# Patient Record
Sex: Male | Born: 1975 | Race: White | Hispanic: No | Marital: Single | Smoking: Never smoker
Health system: Southern US, Community
[De-identification: ages and names within clinical notes are randomized; demographics above are authoritative.]

---

## 2009-05-21 ENCOUNTER — Emergency Department (HOSPITAL_COMMUNITY): Admission: EM | Admit: 2009-05-21 | Discharge: 2009-05-22 | Payer: Self-pay | Admitting: Emergency Medicine

## 2010-01-08 ENCOUNTER — Encounter: Admission: RE | Admit: 2010-01-08 | Discharge: 2010-01-08 | Payer: Self-pay | Admitting: Neurology

## 2010-08-30 LAB — URINALYSIS, ROUTINE W REFLEX MICROSCOPIC
Bilirubin Urine: NEGATIVE
Glucose, UA: NEGATIVE mg/dL
Leukocytes, UA: NEGATIVE
Nitrite: NEGATIVE
Specific Gravity, Urine: 1.018 (ref 1.005–1.030)

## 2010-08-30 LAB — URINE MICROSCOPIC-ADD ON
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
## 2014-12-12 ENCOUNTER — Encounter (HOSPITAL_COMMUNITY): Payer: Self-pay

## 2014-12-12 ENCOUNTER — Emergency Department (HOSPITAL_COMMUNITY)
Admission: EM | Admit: 2014-12-12 | Discharge: 2014-12-12 | Disposition: A | Discharge status: Home / Self Care | Payer: BC Managed Care – PPO | Source: Home / Self Care | Attending: Family Medicine | Admitting: Family Medicine

## 2014-12-12 DIAGNOSIS — Z23 Encounter for immunization: Secondary | ICD-10-CM | POA: Diagnosis not present

## 2014-12-12 DIAGNOSIS — S0191XA Laceration without foreign body of unspecified part of head, initial encounter: Secondary | ICD-10-CM | POA: Diagnosis not present

## 2014-12-12 MED ORDER — TETANUS-DIPHTH-ACELL PERTUSSIS 5-2.5-18.5 LF-MCG/0.5 IM SUSP
0.5000 mL | Freq: Once | INTRAMUSCULAR | Status: AC
  Administered 2014-12-12: 0.5 mL via INTRAMUSCULAR

## 2014-12-12 NOTE — ED Provider Notes (Addendum)
CSN: [id number]     Arrival date & time 12/12/14  1841 History   First MD Initiated Contact with Patient 12/12/14 1932     Chief Complaint  Patient presents with  . Laceration   (Consider location/radiation/quality/duration/timing/severity/associated sxs/prior Treatment) HPI Laceration. Sustained one hour ago. Patient ran into the doorway. Unsure of last tetanus. Area started to bleed and immediately swell. This was improved with pressure. Denies any confusion, headache, loss of consciousness. Patient is not on any blood thinners.   History reviewed. No pertinent past medical history. History reviewed. No pertinent past surgical history. History reviewed. No pertinent family history. History  Substance Use Topics  . Smoking status: Never Smoker   . Smokeless tobacco: Not on file  . Alcohol Use: Yes    Review of Systems Per HPI with all other pertinent systems negative.   Allergies  Review of patient's allergies indicates no known allergies.  Home Medications   Prior to Admission medications   Not on File   BP 129/73 mmHg  Pulse 77  Temp(Src) 97.5 F (36.4 C) (Oral)  Resp 16  SpO2 99% Physical Exam Physical Exam  Constitutional: oriented to person, place, and time. appears well-developed and well-nourished. No distress.  HENT:  Head: Normocephalic and atraumatic.  Eyes: EOMI. PERRL.  Neck: Normal range of motion.  Cardiovascular: RRR, no m/r/g, 2+ distal pulses,  Pulmonary/Chest: Effort normal and breath sounds normal. No respiratory distress.  Abdominal: Soft. Bowel sounds are normal. NonTTP, no distension.  Musculoskeletal: Normal range of motion. Non ttp, no effusion.  Neurological: alert and oriented to person, place, and time.  Skin: 2.5 vertical linear laceration above the right eyebrow. This extends through the epidermis but not through the dermis.  Psychiatric: normal mood and affect. behavior is normal. Judgment and thought content normal.   ED Course   Procedures (including critical care time) Labs Review Labs Reviewed - No data to display  Imaging Review No results found.   MDM   1. Laceration of head, initial encounter    Simple laceration repaired with Steri-Strips. Patient given very detailed wound care instructions. Antibiotic ointment applied.  Tdap given  Ozella Rocksavid J Merrell, MD 12/12/14 [id number]  Ozella Rocksavid J Merrell, MD 12/17/14 [id number]

## 2014-12-12 NOTE — Discharge Instructions (Signed)
Laceration you sustained to your head fortunately only went through the epidermis and on the dermis. This should allow your head to heal very quickly. 2 Steri-Strips were applied to help improve wound healing and to prevent further damage to your skin. Please trim off the edges of the Steri-Strips over the next several days as they start to peel. Please remember to protect the area from sun damage and to apply vitamin E ointment to help soften the scar over time.
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
## 2022-06-02 ENCOUNTER — Encounter: Payer: Self-pay | Admitting: Neurology

## 2022-06-27 ENCOUNTER — Ambulatory Visit: Payer: BC Managed Care – PPO | Admitting: Neurology

## 2022-06-27 ENCOUNTER — Encounter: Payer: Self-pay | Admitting: Neurology

## 2022-06-27 VITALS — BP 128/87 | HR 75 | Ht 70.0 in | Wt 234.0 lb

## 2022-06-27 DIAGNOSIS — G44221 Chronic tension-type headache, intractable: Secondary | ICD-10-CM
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
## 2022-09-26 ENCOUNTER — Ambulatory Visit: Payer: BC Managed Care – PPO | Admitting: Neurology
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
## 2022-11-22 ENCOUNTER — Encounter: Payer: Self-pay | Admitting: Neurology

## 2022-11-22 ENCOUNTER — Ambulatory Visit: Payer: BC Managed Care – PPO | Admitting: Neurology

## 2022-11-22 VITALS — BP 127/82 | HR 85 | Ht 70.0 in | Wt 223.0 lb

## 2022-11-22 DIAGNOSIS — M7918 Myalgia, other site: Secondary | ICD-10-CM

## 2022-11-22 DIAGNOSIS — G44221 Chronic tension-type headache, intractable: Secondary | ICD-10-CM

## 2022-11-22 MED ORDER — NORTRIPTYLINE HCL 10 MG PO CAPS: 10.0000 mg | ORAL_CAPSULE | Freq: Every day | ORAL | 5 refills | Status: DC

## 2022-11-22 NOTE — Patient Instructions (Signed)
Stop topiramate.  Start nortriptyline 10mg  at bedtime.  If no improvement in 4 weeks, contact me Cyclobenzaprine 10mg  twice daily Refer to [facility] Limit use of pain relievers to no more than 2 days out of week to prevent risk of rebound or medication-overuse headache. Keep headache diary Follow up 6 months.

## 2022-12-05 ENCOUNTER — Ambulatory Visit (INDEPENDENT_AMBULATORY_CARE_PROVIDER_SITE_OTHER): Payer: BC Managed Care – PPO

## 2022-12-05 ENCOUNTER — Ambulatory Visit: Payer: BC Managed Care – PPO | Admitting: Sports Medicine

## 2022-12-05 VITALS — BP 120/80 | HR 83 | Ht 70.0 in | Wt 223.0 lb

## 2022-12-05 DIAGNOSIS — M542 Cervicalgia: Secondary | ICD-10-CM | POA: Diagnosis not present

## 2022-12-05 DIAGNOSIS — M9908 Segmental and somatic dysfunction of rib cage: Secondary | ICD-10-CM

## 2022-12-05 DIAGNOSIS — R519 Headache, unspecified: Secondary | ICD-10-CM | POA: Diagnosis not present

## 2022-12-05 DIAGNOSIS — M9902 Segmental and somatic dysfunction of thoracic region: Secondary | ICD-10-CM | POA: Diagnosis not present

## 2022-12-05 DIAGNOSIS — G8929 Other chronic pain: Secondary | ICD-10-CM | POA: Diagnosis not present

## 2022-12-05 DIAGNOSIS — M9901 Segmental and somatic dysfunction of cervical region: Secondary | ICD-10-CM | POA: Diagnosis not present

## 2022-12-05 MED ORDER — MELOXICAM 15 MG PO TABS: 15.0000 mg | ORAL_TABLET | Freq: Every day | ORAL | 0 refills | Status: DC

## 2022-12-05 NOTE — Progress Notes (Signed)
Dg                [name] D.[facility] [address] [place] [address] Phone: [phone]   Assessment and Plan:     1. Neck pain 2. Chronic intractable headache, unspecified headache type 3. Somatic dysfunction of cervical region 4. Somatic dysfunction of thoracic region 5. Somatic dysfunction of rib region -Chronic with exacerbation, initial sports medicine visit - Patient presenting with ongoing bilateral headaches that are constant over the past 1 to 2 years.  Patient additionally has muscular tightness through cervical paraspinal, trapezius bilaterally that could contribute to tension headaches.  Patient has had thorough workup for headaches thus far with relatively unremarkable brain MRI only showing sinus inflammation.  Patient has seen Dr. Everlena Cooper, neurology, who has started patient on Flexeril and amitriptyline.  Patient has noticed mild to moderate improvement since starting these medications, though continues to have constant daily headaches - I do believe there is at least a portion of patient's pain that is musculoskeletal based on HPI, physical exam, loss of general cervical lordosis, improvements with "neck cracking" -Recommend using a short 2 to 3-week course of prescription NSAIDs to see if there is an inflammatory component to patient's pain.  Risk of short-term rebound headache after completion of NSAID course.  Start meloxicam 15 mg daily x2 weeks.  If still having pain after 2 weeks, complete 3rd-week of meloxicam. May use remaining meloxicam as needed once daily for pain control.  Do not to use additional NSAIDs while taking meloxicam.  May use Tylenol [phone] mg 2 to 3 times a day for breakthrough pain.  -Start HEP for neck and trapezius - Patient elected for initial OMT today.  Tolerated well per note below. - Decision today to treat with OMT was based on Physical Exam -X-ray obtained in clinic.  My interpretation: No acute fracture or  vertebral collapse.  Anterior vertebral spurring at C4.  Mild loss of typical cervical lordosis  After verbal consent patient was treated with HVLA (high velocity low amplitude), ME (muscle energy), FPR (flex positional release), ST (soft tissue),  techniques in cervical, rib, thoracic,  areas. Patient tolerated the procedure well with improvement in symptoms.  Patient educated on potential side effects of soreness and recommended to rest, hydrate, and use Tylenol as needed for pain control.     Pertinent previous records reviewed include brain MRI in 06/20/2010, neurology note 11/22/2022, ENT note 08/09/2022   Follow Up: 4 weeks for reevaluation.  Could consider repeat OMT if patient tolerated today's procedure well.   Subjective:   I, [name], am serving as a Neurosurgeon for Doctor Richardean Sale  Chief Complaint: neck pain   HPI:   12/05/22 Patient is a 47 year old male complaining of neck pain. Patient states that he has headaches and he cracks his neck and that gives him some relief. Flexeril and nortriptyline help with headaches. Pain for a year to two years. No radiating pain. Just headache, neck, and upper trap pain and knots. Has tried chiro, massage, heat and ice. No numbness and tingling. Chirps seem to help    Relevant Historical Information: None pertinent  Additional pertinent review of systems negative.   Current Outpatient Medications:    cyclobenzaprine (FLEXERIL) 10 MG tablet, Take 10 mg by mouth 3 (three) times daily., Disp: , Rfl:    meloxicam (MOBIC) 15 MG tablet, Take 1 tablet (15 mg total) by mouth daily., Disp: 30 tablet, Rfl: 0  nortriptyline (PAMELOR) 10 MG capsule, Take 1 capsule (10 mg total) by mouth at bedtime., Disp: 30 capsule, Rfl: 5   Objective:     Vitals:   12/05/22 1537  BP: 120/80  Pulse: 83  SpO2: 96%  Weight: 223 lb (101.2 kg)  Height: 5\' 10"  (1.778 m)      Body mass index is 32 kg/m.    Physical Exam:    Neck Exam: Cervical  Spine- Posture normal Skin- normal, intact  Neuro:  Strength-  Right Left   Deltoid (C5) 5/5 5/5  Bicep/Brachioradialis (C5/6) 5/5  5/5  Wrist Extension (C6) 5/5 5/5  Tricep (C7) 5/5 5/5  Wrist Flexion (C7) 5/5 5/5  Grip (C8) 5/5 5/5  Finger Abduction (T1) 5/5 5/5   Sensation: intact to light touch in upper extremities bilaterally  Spurling's:  negative bilaterally Neck ROM: Full active ROM   NTTP: cervical spinous processes, cervical paraspinal, thoracic paraspinal, trapezius     OMT Physical Exam:  ASIS Compression Test: Positive Right Cervical: NTTP paraspinal, C3 RR SL, C6 RLSR Rib: Bilateral elevated first rib with TTP Thoracic: NTTP paraspinal, T4-6 RRSL, T7-9 RLSR    Electronically signed by:  [name] D.[facility] 4:46 PM 12/05/22

## 2022-12-05 NOTE — Patient Instructions (Signed)
-   Start meloxicam 15 mg daily x2 weeks.  If still having pain after 2 weeks, complete 3rd-week of meloxicam. May use remaining meloxicam as needed once daily for pain control.  Do not to use additional NSAIDs while taking meloxicam.  May use Tylenol [phone] mg 2 to 3 times a day for breakthrough pain. Neck and trap HEP  4 week follow up [facility]

## 2022-12-15 ENCOUNTER — Other Ambulatory Visit: Payer: Self-pay | Admitting: Neurology

## 2022-12-15 ENCOUNTER — Encounter: Payer: Self-pay | Admitting: Neurology

## 2022-12-15 NOTE — Telephone Encounter (Signed)
New start 3 weeks ago, Has been taken it if not please take the one month for now. If he hast taken for the last three weeks we will send in a 90 day supply for reaming refills.

## 2022-12-22 ENCOUNTER — Other Ambulatory Visit: Payer: Self-pay

## 2022-12-22 MED ORDER — CYCLOBENZAPRINE HCL 10 MG PO TABS: 10.0000 mg | ORAL_TABLET | Freq: Three times a day (TID) | ORAL | 5 refills | Status: DC

## 2022-12-22 MED ORDER — NORTRIPTYLINE HCL 25 MG PO CAPS: 25.0000 mg | ORAL_CAPSULE | Freq: Every day | ORAL | 0 refills | Status: DC

## 2022-12-22 MED ORDER — PREDNISONE 10 MG (21) PO TBPK: ORAL_TABLET | ORAL | 0 refills | Status: DC

## 2022-12-22 NOTE — Progress Notes (Signed)
Per Dr.Jaffe, 1. Since this headache has been persistent for a week, I would like to prescribe a prednisone taper (if he is agreeable, please send in prescription).   2.  Going forward, when he gets a headache attack, he should take the cyclobenzaprine.  May take with ibuprofen, Aleve/naproxen or Excedrin but must limit use to no more than 2 days out of week to prevent rebound headache.  Do not take an NSAID (such as ibuprofen or Aleve) while on the prednisone taper. 3.  Increase nortriptyline to 25mg  at bedtime.  We can increase dose in 4 weeks if needed.

## 2023-01-03 NOTE — Progress Notes (Unsigned)
    [name] [address] [place] [address] Phone: [phone]   Assessment and Plan:     There are no diagnoses linked to this encounter.  ***   Pertinent previous records reviewed include ***   Follow Up: ***     Subjective:   I, Madisun Hargrove, am serving as a Neurosurgeon for Doctor Richardean Sale   Chief Complaint: neck pain    HPI:    12/05/22 Patient is a 47 year old male complaining of neck pain. Patient states that he has headaches and he cracks his neck and that gives him some relief. Flexeril and nortriptyline help with headaches. Pain for a year to two years. No radiating pain. Just headache, neck, and upper trap pain and knots. Has tried chiro, massage, heat and ice. No numbness and tingling. Chirps seem to help    01/04/2023 Patient states    Relevant Historical Information: None pertinent  Additional pertinent review of systems negative.   Current Outpatient Medications:    cyclobenzaprine (FLEXERIL) 10 MG tablet, Take 1 tablet (10 mg total) by mouth 3 (three) times daily., Disp: 90 tablet, Rfl: 5   meloxicam (MOBIC) 15 MG tablet, Take 1 tablet (15 mg total) by mouth daily., Disp: 30 tablet, Rfl: 0   nortriptyline (PAMELOR) 25 MG capsule, Take 1 capsule (25 mg total) by mouth at bedtime., Disp: 30 capsule, Rfl: 0   predniSONE (STERAPRED UNI-PAK 21 TAB) 10 MG (21) TBPK tablet, take 60mg  day 1, then 50mg  day 2, then 40mg  day 3, then 30mg  day 4, then 20mg  day 5, then 10mg  day 6, then STOP, Disp: 21 tablet, Rfl: 0   Objective:     There were no vitals filed for this visit.    There is no height or weight on file to calculate BMI.    Physical Exam:    ***   Electronically signed by:  [name] 12:31 PM 01/03/23

## 2023-01-04 ENCOUNTER — Ambulatory Visit: Payer: BC Managed Care – PPO | Admitting: Sports Medicine

## 2023-01-04 VITALS — BP 132/82 | HR 87 | Ht 70.0 in | Wt 222.0 lb

## 2023-01-04 DIAGNOSIS — M9908 Segmental and somatic dysfunction of rib cage: Secondary | ICD-10-CM | POA: Diagnosis not present

## 2023-01-04 DIAGNOSIS — M542 Cervicalgia: Secondary | ICD-10-CM

## 2023-01-04 DIAGNOSIS — M9902 Segmental and somatic dysfunction of thoracic region: Secondary | ICD-10-CM | POA: Diagnosis not present

## 2023-01-04 DIAGNOSIS — M9901 Segmental and somatic dysfunction of cervical region: Secondary | ICD-10-CM

## 2023-01-04 DIAGNOSIS — G8929 Other chronic pain: Secondary | ICD-10-CM

## 2023-01-04 DIAGNOSIS — R519 Headache, unspecified: Secondary | ICD-10-CM

## 2023-01-04 NOTE — Patient Instructions (Signed)
Pt referral  Discontinue meloxicam and use remainder as needed 4 week follow up

## 2023-01-13 ENCOUNTER — Other Ambulatory Visit: Payer: Self-pay | Admitting: Neurology

## 2023-01-31 NOTE — Progress Notes (Deleted)
   [name] [facility] [address] [place] [address] Phone: [phone]   Assessment and Plan:     There are no diagnoses linked to this encounter.  *** - Patient has received relief with OMT in the past.  Elects for repeat OMT today.  Tolerated well per note below. - Decision today to treat with OMT was based on Physical Exam   After verbal consent patient was treated with HVLA (high velocity low amplitude), ME (muscle energy), FPR (flex positional release), ST (soft tissue), PC/PD (Pelvic Compression/ Pelvic Decompression) techniques in cervical, rib, thoracic, lumbar, and pelvic areas. Patient tolerated the procedure well with improvement in symptoms.  Patient educated on potential side effects of soreness and recommended to rest, hydrate, and use Tylenol as needed for pain control.   Pertinent previous records reviewed include ***   Follow Up: ***     Subjective:   I, [name], am serving as a Neurosurgeon for Doctor Richardean Sale   Chief Complaint: neck pain    HPI:    12/05/22 Patient is a 47 year old male complaining of neck pain. Patient states that he has headaches and he cracks his neck and that gives him some relief. Flexeril and nortriptyline help with headaches. Pain for a year to two years. No radiating pain. Just headache, neck, and upper trap pain and knots. Has tried chiro, massage, heat and ice. No numbness and tingling. Chirps seem to help     01/04/2023 Patient states that he is doing alright is doing better but still has some knots . Would like a PT referral    02/01/2023 Patient states   Relevant Historical Information: None pertinent  Additional pertinent review of systems negative.  Current Outpatient Medications  Medication Sig Dispense Refill   cyclobenzaprine (FLEXERIL) 10 MG tablet Take 1 tablet (10 mg total) by mouth 3 (three) times daily. 90 tablet 5   meloxicam (MOBIC) 15 MG tablet Take 1 tablet (15 mg  total) by mouth daily. 30 tablet 0   nortriptyline (PAMELOR) 25 MG capsule TAKE 1 CAPSULE BY MOUTH AT BEDTIME. 90 capsule 0   predniSONE (STERAPRED UNI-PAK 21 TAB) 10 MG (21) TBPK tablet take 60mg  day 1, then 50mg  day 2, then 40mg  day 3, then 30mg  day 4, then 20mg  day 5, then 10mg  day 6, then STOP 21 tablet 0   No current facility-administered medications for this visit.      Objective:     There were no vitals filed for this visit.    There is no height or weight on file to calculate BMI.    Physical Exam:     General: Well-appearing, cooperative, sitting comfortably in no acute distress.   OMT Physical Exam:  ASIS Compression Test: Positive Right Cervical: TTP paraspinal, *** Rib: Bilateral elevated first rib with TTP Thoracic: TTP paraspinal,*** Lumbar: TTP paraspinal,*** Pelvis: Right anterior innominate  Electronically signed by:  [name] [facility] 7:22 AM 01/31/23

## 2023-02-01 ENCOUNTER — Ambulatory Visit: Payer: BC Managed Care – PPO | Admitting: Sports Medicine

## 2023-02-06 NOTE — Progress Notes (Unsigned)
   [name] [facility] [address] [place] [address] Phone: [phone]   Assessment and Plan:     There are no diagnoses linked to this encounter.  *** - Patient has received relief with OMT in the past.  Elects for repeat OMT today.  Tolerated well per note below. - Decision today to treat with OMT was based on Physical Exam   After verbal consent patient was treated with HVLA (high velocity low amplitude), ME (muscle energy), FPR (flex positional release), ST (soft tissue), PC/PD (Pelvic Compression/ Pelvic Decompression) techniques in cervical, rib, thoracic, lumbar, and pelvic areas. Patient tolerated the procedure well with improvement in symptoms.  Patient educated on potential side effects of soreness and recommended to rest, hydrate, and use Tylenol as needed for pain control.   Pertinent previous records reviewed include ***   Follow Up: ***     Subjective:   I, [name], am serving as a Neurosurgeon for Doctor Richardean Sale   Chief Complaint: neck pain    HPI:    12/05/22 Patient is a 47 year old male complaining of neck pain. Patient states that he has headaches and he cracks his neck and that gives him some relief. Flexeril and nortriptyline help with headaches. Pain for a year to two years. No radiating pain. Just headache, neck, and upper trap pain and knots. Has tried chiro, massage, heat and ice. No numbness and tingling. Chirps seem to help     01/04/2023 Patient states that he is doing alright is doing better but still has some knots . Would like a PT referral    02/07/2023 Patient states    Relevant Historical Information: None pertinent  Additional pertinent review of systems negative.  Current Outpatient Medications  Medication Sig Dispense Refill   cyclobenzaprine (FLEXERIL) 10 MG tablet Take 1 tablet (10 mg total) by mouth 3 (three) times daily. 90 tablet 5   meloxicam (MOBIC) 15 MG tablet Take 1 tablet (15 mg  total) by mouth daily. 30 tablet 0   nortriptyline (PAMELOR) 25 MG capsule TAKE 1 CAPSULE BY MOUTH AT BEDTIME. 90 capsule 0   predniSONE (STERAPRED UNI-PAK 21 TAB) 10 MG (21) TBPK tablet take 60mg  day 1, then 50mg  day 2, then 40mg  day 3, then 30mg  day 4, then 20mg  day 5, then 10mg  day 6, then STOP 21 tablet 0   No current facility-administered medications for this visit.      Objective:     There were no vitals filed for this visit.    There is no height or weight on file to calculate BMI.    Physical Exam:     General: Well-appearing, cooperative, sitting comfortably in no acute distress.   OMT Physical Exam:  ASIS Compression Test: Positive Right Cervical: TTP paraspinal, *** Rib: Bilateral elevated first rib with TTP Thoracic: TTP paraspinal,*** Lumbar: TTP paraspinal,*** Pelvis: Right anterior innominate  Electronically signed by:  [name] [facility] 4:39 PM 02/06/23

## 2023-02-07 ENCOUNTER — Ambulatory Visit: Payer: BC Managed Care – PPO | Admitting: Sports Medicine

## 2023-02-07 VITALS — BP 120/82 | HR 92 | Ht 70.0 in | Wt 222.0 lb

## 2023-02-07 DIAGNOSIS — M9902 Segmental and somatic dysfunction of thoracic region: Secondary | ICD-10-CM

## 2023-02-07 DIAGNOSIS — M542 Cervicalgia: Secondary | ICD-10-CM | POA: Diagnosis not present

## 2023-02-07 DIAGNOSIS — M9901 Segmental and somatic dysfunction of cervical region: Secondary | ICD-10-CM | POA: Diagnosis not present

## 2023-02-07 DIAGNOSIS — G8929 Other chronic pain: Secondary | ICD-10-CM

## 2023-02-07 DIAGNOSIS — M9908 Segmental and somatic dysfunction of rib cage: Secondary | ICD-10-CM

## 2023-02-07 DIAGNOSIS — R519 Headache, unspecified: Secondary | ICD-10-CM | POA: Diagnosis not present

## 2023-02-07 NOTE — Patient Instructions (Signed)
Use remaining meloxicam as needed Continue PT  Track headaches to see if you notice a time 4 week follow up

## 2023-02-09 ENCOUNTER — Telehealth: Payer: Self-pay

## 2023-02-09 MED ORDER — PREDNISONE 10 MG (21) PO TBPK: ORAL_TABLET | ORAL | 0 refills | Status: DC

## 2023-02-09 MED ORDER — NORTRIPTYLINE HCL 25 MG PO CAPS: 50.0000 mg | ORAL_CAPSULE | Freq: Every day | ORAL | 1 refills | Status: DC

## 2023-02-09 NOTE — Telephone Encounter (Signed)
Per Dr.Jaffe, We can prescribe him a prednisone taper (I wouldn't take any NSAIDs while on the taper) and increase nortriptyline to 50mg  at bedtime.  We can increase nortriptyline in another 4-6 weeks if needed.   Scripts sent per patient request.

## 2023-03-07 ENCOUNTER — Ambulatory Visit: Payer: BC Managed Care – PPO | Admitting: Sports Medicine

## 2023-03-07 VITALS — HR 98 | Ht 70.0 in | Wt 230.0 lb

## 2023-03-07 DIAGNOSIS — R519 Headache, unspecified: Secondary | ICD-10-CM | POA: Diagnosis not present

## 2023-03-07 DIAGNOSIS — G8929 Other chronic pain: Secondary | ICD-10-CM

## 2023-03-07 DIAGNOSIS — M9902 Segmental and somatic dysfunction of thoracic region: Secondary | ICD-10-CM

## 2023-03-07 DIAGNOSIS — M9901 Segmental and somatic dysfunction of cervical region: Secondary | ICD-10-CM | POA: Diagnosis not present

## 2023-03-07 DIAGNOSIS — M542 Cervicalgia: Secondary | ICD-10-CM | POA: Diagnosis not present

## 2023-03-07 DIAGNOSIS — M9908 Segmental and somatic dysfunction of rib cage: Secondary | ICD-10-CM

## 2023-03-07 NOTE — Progress Notes (Signed)
[name] [facility] [address] [place] [address] Phone: [phone]   Assessment and Plan:     1. Neck pain 2. Chronic intractable headache, unspecified headache type 3. Somatic dysfunction of cervical region 4. Somatic dysfunction of thoracic region 5. Somatic dysfunction of rib region  -Chronic with exacerbation, subsequent visit - Overall improvement with OMT, intermittent meloxicam use, physical therapy, patient continues to have neck pain and headaches that seem consistent with tension headaches - Continue HEP and physical therapy - May continue to use meloxicam as needed for breakthrough pain - Patient has received relief with OMT in the past.  Elects for repeat OMT today.  Tolerated well per note below. - Decision today to treat with OMT was based on Physical Exam   After verbal consent patient was treated with HVLA (high velocity low amplitude), ME (muscle energy), FPR (flex positional release), ST (soft tissue),  techniques in cervical, rib, thoracic,  areas. Patient tolerated the procedure well with improvement in symptoms.  Patient educated on potential side effects of soreness and recommended to rest, hydrate, and use Tylenol as needed for pain control.   Pertinent previous records reviewed include none   Follow Up: 4 weeks for reevaluation.  Could consider repeat OMT   Subjective:   I, [name], am serving as a Neurosurgeon for [name]   Chief Complaint: neck pain    HPI:    12/05/22 Patient is a 47 year old male complaining of neck pain. Patient states that he has headaches and he cracks his neck and that gives him some relief. Flexeril and nortriptyline help with headaches. Pain for a year to two years. No radiating pain. Just headache, neck, and upper trap pain and knots. Has tried chiro, massage, heat and ice. No numbness and tingling. Chirps seem to help     01/04/2023 Patient states that he is doing alright  is doing better but still has some knots . Would like a PT referral    02/07/2023 Patient states headache is bad today    03/07/2023 Patient states that he is good still having headaches not as frequent or intense    Relevant Historical Information: None pertinent  Additional pertinent review of systems negative.  Current Outpatient Medications  Medication Sig Dispense Refill   cyclobenzaprine (FLEXERIL) 10 MG tablet Take 1 tablet (10 mg total) by mouth 3 (three) times daily. 90 tablet 5   meloxicam (MOBIC) 15 MG tablet Take 1 tablet (15 mg total) by mouth daily. 30 tablet 0   nortriptyline (PAMELOR) 25 MG capsule Take 2 capsules (50 mg total) by mouth at bedtime. 60 capsule 1   predniSONE (STERAPRED UNI-PAK 21 TAB) 10 MG (21) TBPK tablet take 60mg  day 1, then 50mg  day 2, then 40mg  day 3, then 30mg  day 4, then 20mg  day 5, then 10mg  day 6, then STOP 21 tablet 0   No current facility-administered medications for this visit.      Objective:     Vitals:   03/07/23 1556  Pulse: 98  SpO2: 100%  Weight: 230 lb (104.3 kg)  Height: 5\' 10"  (1.778 m)      Body mass index is 33 kg/m.    Physical Exam:     General: Well-appearing, cooperative, sitting comfortably in no acute distress.   OMT Physical Exam:   Cervical: TTP paraspinal, C3-5 RR SR Rib: Right elevated first rib with NTTP Thoracic: TTP paraspinal, T4-6 RRSL, T7-9 RLSR  Electronically signed by:  [name] [facility] 4:07 PM 03/07/23

## 2023-03-16 ENCOUNTER — Other Ambulatory Visit: Payer: Self-pay | Admitting: Neurology

## 2023-04-03 NOTE — Progress Notes (Unsigned)
   [name] [facility] [address] [place] [address] Phone: [phone]   Assessment and Plan:     There are no diagnoses linked to this encounter.  *** - Patient has received relief with OMT in the past.  Elects for repeat OMT today.  Tolerated well per note below. - Decision today to treat with OMT was based on Physical Exam   After verbal consent patient was treated with HVLA (high velocity low amplitude), ME (muscle energy), FPR (flex positional release), ST (soft tissue), PC/PD (Pelvic Compression/ Pelvic Decompression) techniques in cervical, rib, thoracic, lumbar, and pelvic areas. Patient tolerated the procedure well with improvement in symptoms.  Patient educated on potential side effects of soreness and recommended to rest, hydrate, and use Tylenol as needed for pain control.   Pertinent previous records reviewed include ***   Follow Up: ***     Subjective:   I, [name], am serving as a Neurosurgeon for [name]   Chief Complaint: neck pain    HPI:    12/05/22 Patient is a 47 year old male complaining of neck pain. Patient states that he has headaches and he cracks his neck and that gives him some relief. Flexeril and nortriptyline help with headaches. Pain for a year to two years. No radiating pain. Just headache, neck, and upper trap pain and knots. Has tried chiro, massage, heat and ice. No numbness and tingling. Chirps seem to help     01/04/2023 Patient states that he is doing alright is doing better but still has some knots . Would like a PT referral    02/07/2023 Patient states headache is bad today    03/07/2023 Patient states that he is good still having headaches not as frequent or intense    04/04/2023 Patient states   Additional pertinent review of systems negative.  Current Outpatient Medications  Medication Sig Dispense Refill   cyclobenzaprine (FLEXERIL) 10 MG tablet Take 1 tablet (10 mg total) by  mouth 3 (three) times daily. 90 tablet 5   meloxicam (MOBIC) 15 MG tablet Take 1 tablet (15 mg total) by mouth daily. 30 tablet 0   nortriptyline (PAMELOR) 25 MG capsule Take 2 capsules (50 mg total) by mouth at bedtime. 60 capsule 1   predniSONE (STERAPRED UNI-PAK 21 TAB) 10 MG (21) TBPK tablet take 60mg  day 1, then 50mg  day 2, then 40mg  day 3, then 30mg  day 4, then 20mg  day 5, then 10mg  day 6, then STOP 21 tablet 0   No current facility-administered medications for this visit.      Objective:     There were no vitals filed for this visit.    There is no height or weight on file to calculate BMI.    Physical Exam:     General: Well-appearing, cooperative, sitting comfortably in no acute distress.   OMT Physical Exam:  ASIS Compression Test: Positive Right Cervical: TTP paraspinal, *** Rib: Bilateral elevated first rib with TTP Thoracic: TTP paraspinal,*** Lumbar: TTP paraspinal,*** Pelvis: Right anterior innominate  Electronically signed by:  [name] [facility] 4:00 PM 04/03/23

## 2023-04-04 ENCOUNTER — Ambulatory Visit: Payer: BC Managed Care – PPO | Admitting: Sports Medicine

## 2023-04-04 VITALS — HR 96 | Ht 70.0 in | Wt 228.0 lb

## 2023-04-04 DIAGNOSIS — M542 Cervicalgia: Secondary | ICD-10-CM | POA: Diagnosis not present

## 2023-04-04 DIAGNOSIS — M9901 Segmental and somatic dysfunction of cervical region: Secondary | ICD-10-CM

## 2023-04-04 DIAGNOSIS — R519 Headache, unspecified: Secondary | ICD-10-CM | POA: Diagnosis not present

## 2023-04-04 DIAGNOSIS — M9902 Segmental and somatic dysfunction of thoracic region: Secondary | ICD-10-CM

## 2023-04-04 DIAGNOSIS — G8929 Other chronic pain: Secondary | ICD-10-CM

## 2023-04-04 DIAGNOSIS — M9908 Segmental and somatic dysfunction of rib cage: Secondary | ICD-10-CM

## 2023-04-07 ENCOUNTER — Other Ambulatory Visit: Payer: Self-pay | Admitting: Neurology

## 2023-05-01 NOTE — Progress Notes (Unsigned)
   [name] [address] [place] [address] Phone: [phone]   Assessment and Plan:     There are no diagnoses linked to this encounter.  *** - Patient has received relief with OMT in the past.  Elects for repeat OMT today.  Tolerated well per note below. - Decision today to treat with OMT was based on Physical Exam   After verbal consent patient was treated with HVLA (high velocity low amplitude), ME (muscle energy), FPR (flex positional release), ST (soft tissue), PC/PD (Pelvic Compression/ Pelvic Decompression) techniques in cervical, rib, thoracic, lumbar, and pelvic areas. Patient tolerated the procedure well with improvement in symptoms.  Patient educated on potential side effects of soreness and recommended to rest, hydrate, and use Tylenol as needed for pain control.   Pertinent previous records reviewed include ***    Follow Up: ***     Subjective:   I, Dean Goldner, am serving as a Neurosurgeon for Doctor Richardean Sale   Chief Complaint: neck pain    HPI:    12/05/22 Patient is a 47 year old male complaining of neck pain. Patient states that he has headaches and he cracks his neck and that gives him some relief. Flexeril and nortriptyline help with headaches. Pain for a year to two years. No radiating pain. Just headache, neck, and upper trap pain and knots. Has tried chiro, massage, heat and ice. No numbness and tingling. Chirps seem to help     01/04/2023 Patient states that he is doing alright is doing better but still has some knots . Would like a PT referral    02/07/2023 Patient states headache is bad today    03/07/2023 Patient states that he is good still having headaches not as frequent or intense    04/04/2023 Patient states that his headaches are much better   05/02/2023 Patient states   Additional pertinent review of systems negative.  Current Outpatient Medications  Medication Sig Dispense Refill    cyclobenzaprine (FLEXERIL) 10 MG tablet Take 1 tablet (10 mg total) by mouth 3 (three) times daily. 90 tablet 5   meloxicam (MOBIC) 15 MG tablet Take 1 tablet (15 mg total) by mouth daily. 30 tablet 0   nortriptyline (PAMELOR) 25 MG capsule TAKE 2 CAPSULES (50 MG TOTAL) BY MOUTH EVERY DAY AT BEDTIME 180 capsule 1   predniSONE (STERAPRED UNI-PAK 21 TAB) 10 MG (21) TBPK tablet take 60mg  day 1, then 50mg  day 2, then 40mg  day 3, then 30mg  day 4, then 20mg  day 5, then 10mg  day 6, then STOP 21 tablet 0   No current facility-administered medications for this visit.      Objective:     There were no vitals filed for this visit.    There is no height or weight on file to calculate BMI.    Physical Exam:     General: Well-appearing, cooperative, sitting comfortably in no acute distress.   OMT Physical Exam:  ASIS Compression Test: Positive Right Cervical: TTP paraspinal, *** Rib: Bilateral elevated first rib with TTP Thoracic: TTP paraspinal,*** Lumbar: TTP paraspinal,*** Pelvis: Right anterior innominate  Electronically signed by:  [name] 8:41 AM 05/01/23

## 2023-05-02 ENCOUNTER — Ambulatory Visit: Payer: BC Managed Care – PPO | Admitting: Sports Medicine

## 2023-05-02 VITALS — HR 99 | Ht 70.0 in | Wt 232.0 lb

## 2023-05-02 DIAGNOSIS — M9902 Segmental and somatic dysfunction of thoracic region: Secondary | ICD-10-CM | POA: Diagnosis not present

## 2023-05-02 DIAGNOSIS — G8929 Other chronic pain: Secondary | ICD-10-CM

## 2023-05-02 DIAGNOSIS — M9901 Segmental and somatic dysfunction of cervical region: Secondary | ICD-10-CM

## 2023-05-02 DIAGNOSIS — M542 Cervicalgia: Secondary | ICD-10-CM | POA: Diagnosis not present

## 2023-05-02 DIAGNOSIS — M9908 Segmental and somatic dysfunction of rib cage: Secondary | ICD-10-CM

## 2023-05-02 DIAGNOSIS — R519 Headache, unspecified: Secondary | ICD-10-CM | POA: Diagnosis not present

## 2023-05-22 NOTE — Progress Notes (Deleted)
   [name] [address] [place] [address] Phone: [phone]   Assessment and Plan:     There are no diagnoses linked to this encounter.  *** - Patient has received relief with OMT in the past.  Elects for repeat OMT today.  Tolerated well per note below. - Decision today to treat with OMT was based on Physical Exam   After verbal consent patient was treated with HVLA (high velocity low amplitude), ME (muscle energy), FPR (flex positional release), ST (soft tissue), PC/PD (Pelvic Compression/ Pelvic Decompression) techniques in cervical, rib, thoracic, lumbar, and pelvic areas. Patient tolerated the procedure well with improvement in symptoms.  Patient educated on potential side effects of soreness and recommended to rest, hydrate, and use Tylenol as needed for pain control.   Pertinent previous records reviewed include ***    Follow Up: ***     Subjective:   I, [name], am serving as a Neurosurgeon for Doctor Richardean Sale   Chief Complaint: neck pain    HPI:    12/05/22 Patient is a 47 year old male complaining of neck pain. Patient states that he has headaches and he cracks his neck and that gives him some relief. Flexeril and nortriptyline help with headaches. Pain for a year to two years. No radiating pain. Just headache, neck, and upper trap pain and knots. Has tried chiro, massage, heat and ice. No numbness and tingling. Chirps seem to help     01/04/2023 Patient states that he is doing alright is doing better but still has some knots . Would like a PT referral    02/07/2023 Patient states headache is bad today    03/07/2023 Patient states that he is good still having headaches not as frequent or intense    04/04/2023 Patient states that his headaches are much better    05/02/2023 Patient states that he is good.  has been able to manage headaches. Is having right wrist lump under the skin that just popped up a few days  ago   06/01/2023 Patient states   Additional pertinent review of systems negative.  Current Outpatient Medications  Medication Sig Dispense Refill   cyclobenzaprine (FLEXERIL) 10 MG tablet Take 1 tablet (10 mg total) by mouth 3 (three) times daily. 90 tablet 5   meloxicam (MOBIC) 15 MG tablet Take 1 tablet (15 mg total) by mouth daily. 30 tablet 0   nortriptyline (PAMELOR) 25 MG capsule TAKE 2 CAPSULES (50 MG TOTAL) BY MOUTH EVERY DAY AT BEDTIME 180 capsule 1   predniSONE (STERAPRED UNI-PAK 21 TAB) 10 MG (21) TBPK tablet take 60mg  day 1, then 50mg  day 2, then 40mg  day 3, then 30mg  day 4, then 20mg  day 5, then 10mg  day 6, then STOP 21 tablet 0   No current facility-administered medications for this visit.      Objective:     There were no vitals filed for this visit.    There is no height or weight on file to calculate BMI.    Physical Exam:     General: Well-appearing, cooperative, sitting comfortably in no acute distress.   OMT Physical Exam:  ASIS Compression Test: Positive Right Cervical: TTP paraspinal, *** Rib: Bilateral elevated first rib with TTP Thoracic: TTP paraspinal,*** Lumbar: TTP paraspinal,*** Pelvis: Right anterior innominate  Electronically signed by:  [name] 8:55 AM 05/22/23

## 2023-05-30 ENCOUNTER — Ambulatory Visit: Payer: BC Managed Care – PPO | Admitting: Neurology

## 2023-06-01 ENCOUNTER — Ambulatory Visit: Payer: BC Managed Care – PPO | Admitting: Sports Medicine
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
## 2023-07-04 ENCOUNTER — Ambulatory Visit: Payer: BC Managed Care – PPO | Admitting: Neurology

## 2023-10-13 ENCOUNTER — Other Ambulatory Visit: Payer: Self-pay | Admitting: Neurology
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
## 2023-10-23 ENCOUNTER — Encounter: Payer: Self-pay | Admitting: Neurology

## 2023-10-24 ENCOUNTER — Ambulatory Visit: Admitting: Neurology

## 2023-10-24 ENCOUNTER — Ambulatory Visit: Payer: Self-pay | Admitting: Neurology

## 2023-11-10 ENCOUNTER — Other Ambulatory Visit: Payer: Self-pay | Admitting: Neurology

## 2023-12-19 ENCOUNTER — Other Ambulatory Visit: Payer: Self-pay | Admitting: *Deleted

## 2023-12-19 DIAGNOSIS — Z0000 Encounter for general adult medical examination without abnormal findings: Secondary | ICD-10-CM

## 2024-01-05 ENCOUNTER — Other Ambulatory Visit

## 2024-01-05 ENCOUNTER — Ambulatory Visit
Admission: RE | Admit: 2024-01-05 | Discharge: 2024-01-05 | Disposition: A | Discharge status: Home / Self Care | Payer: Self-pay | Source: Ambulatory Visit | Attending: *Deleted | Admitting: *Deleted

## 2024-01-05 DIAGNOSIS — Z0000 Encounter for general adult medical examination without abnormal findings: Secondary | ICD-10-CM

## 2024-01-08 ENCOUNTER — Other Ambulatory Visit: Payer: Self-pay | Admitting: Neurology
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
## 2024-05-08 ENCOUNTER — Encounter: Payer: Self-pay | Admitting: Neurology

## 2024-05-08 ENCOUNTER — Ambulatory Visit: Admitting: Neurology

## 2024-05-14 ENCOUNTER — Ambulatory Visit: Admitting: Neurology

## 2024-07-05 ENCOUNTER — Ambulatory Visit: Admitting: Internal Medicine

## 2024-07-05 ENCOUNTER — Encounter: Payer: Self-pay | Admitting: Internal Medicine

## 2024-07-05 ENCOUNTER — Ambulatory Visit

## 2024-07-05 VITALS — BP 110/70 | HR 84 | Ht 70.0 in | Wt 224.0 lb

## 2024-07-05 DIAGNOSIS — F1090 Alcohol use, unspecified, uncomplicated: Secondary | ICD-10-CM

## 2024-07-05 DIAGNOSIS — R002 Palpitations: Secondary | ICD-10-CM

## 2024-07-05 DIAGNOSIS — M79605 Pain in left leg: Secondary | ICD-10-CM
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
## 2024-07-05 NOTE — Patient Instructions (Signed)
 Medication Instructions:  Your physician recommends that you continue on your current medications as directed. Please refer to the Current Medication list given to you today.  *If you need a refill on your cardiac medications before your next appointment, please call your pharmacy*  Testing/Procedures: ZIO XT- Long Term Monitor Instructions  Your physician has requested you wear a ZIO patch monitor for 7 days.  This is a single patch monitor. [employer] supplies one patch monitor per enrollment. Additional stickers are not available. Please do not apply patch if you will be having a Nuclear Stress Test,  Echocardiogram, Cardiac CT, MRI, or Chest Xray during the period you would be wearing the  monitor. The patch cannot be worn during these tests. You cannot remove and re-apply the  ZIO XT patch monitor.  Your ZIO patch monitor will be mailed 3 day USPS to your address on file. It may take 3-5 days  to receive your monitor after you have been enrolled.  Once you have received your monitor, please review the enclosed instructions. Your monitor  has already been registered assigning a specific monitor serial # to you.  Billing and Patient Assistance Program Information  We have supplied [employer] with any of your insurance information on file for billing purposes. [employer] offers a sliding scale Patient Assistance Program for patients that do not have  insurance, or whose insurance does not completely cover the cost of the ZIO monitor.  You must apply for the Patient Assistance Program to qualify for this discounted rate.  To apply, please call [employer] at [phone], select option 4, select option 2, ask to apply for  Patient Assistance Program. Meredeth will ask your household income, and how many people  are in your household. They will quote your out-of-pocket cost based on that information.  [employer] will also be able to set up a 37-month, interest-free payment plan if needed.  When you are  ready to remove the patch, follow instructions on the last 2 pages of Patient  Logbook. Stick patch monitor onto the last page of Patient Logbook.  Place Patient Logbook in the blue and white box. Use locking tab on box and tape box closed  securely. The blue and white box has prepaid postage on it. Please place it in the mailbox as  soon as possible. Your physician should have your test results approximately 7 days after the  monitor has been mailed back to [facility].  Call [facility] Customer Care at [phone] if you have questions regarding  your ZIO XT patch monitor. Call them immediately if you see an orange light blinking on your  monitor.  If your monitor falls off in less than 4 days, contact our Monitor department at [phone].  If your monitor becomes loose or falls off after 4 days call [employer] at [phone] for  suggestions on securing your monitor   Follow-Up: At [facility], you and your health needs are our priority.  As part of our continuing mission to provide you with exceptional heart care, our providers are all part of one team.  This team includes your primary Cardiologist (physician) and Advanced Practice Providers or APPs (Physician Assistants and Nurse Practitioners) who all work together to provide you with the care you need, when you need it.  Your next appointment:   4-6 week(s)  Provider:   One of our Advanced Practice Providers (APPs): Morse Clause, PA-C  Hanh Waddell Daniels, PA-C  Saddie Cleaves, NP  Olivia Pavy, PA-C Kenzie Campbell, NP  Leontine Salen, PA-C Josefa Beauvais, NP  [place], PA-C Tessa [place], PA-C  [place], PA-C Hunter Davis, PA-C  Damien Braver, NP Jon Hails, PA-C  Waddell Donath, PA-C Dayna Dunn, PA-C  [place], PA-C Madison Fountain, [place] Glendia Ferrier, PA-C Callie Goodrich, PA-C  Katlyn West, NP Thom Sluder, PA-C  Alyssa White, NP Rollo Louder, PA-C Xika Zhao, NP    Lamarr Satterfield,  NP

## 2024-07-05 NOTE — Progress Notes (Unsigned)
 Applied a 7 day Zio XT monitor to patient in the office

## 2024-07-15 ENCOUNTER — Ambulatory Visit: Admitting: Neurology

## 2024-08-29 ENCOUNTER — Ambulatory Visit: Admitting: Cardiology
